# Patient Record
Sex: Female | Born: 1980 | Race: White | Hispanic: No | Marital: Married | Smoking: Never smoker
Health system: Southern US, Community
[De-identification: ages and names within clinical notes are randomized; demographics above are authoritative.]

## PROBLEM LIST (undated history)

## (undated) DIAGNOSIS — I10 Essential (primary) hypertension: Secondary | ICD-10-CM

## (undated) HISTORY — DX: Essential (primary) hypertension: I10

---

## 2014-06-09 ENCOUNTER — Ambulatory Visit: Payer: Self-pay | Admitting: Family Medicine

## 2014-06-16 DIAGNOSIS — A0472 Enterocolitis due to Clostridium difficile, not specified as recurrent: Secondary | ICD-10-CM | POA: Insufficient documentation

## 2014-06-28 ENCOUNTER — Ambulatory Visit (INDEPENDENT_AMBULATORY_CARE_PROVIDER_SITE_OTHER): Payer: PRIVATE HEALTH INSURANCE | Admitting: Internal Medicine

## 2014-06-28 ENCOUNTER — Encounter: Payer: Self-pay | Admitting: Internal Medicine

## 2014-06-28 VITALS — BP 122/64 | HR 120 | Temp 98.5°F | Resp 12 | Ht 63.0 in | Wt 121.0 lb

## 2014-06-28 DIAGNOSIS — R799 Abnormal finding of blood chemistry, unspecified: Secondary | ICD-10-CM

## 2014-06-28 DIAGNOSIS — R7989 Other specified abnormal findings of blood chemistry: Secondary | ICD-10-CM | POA: Insufficient documentation

## 2014-06-28 DIAGNOSIS — R197 Diarrhea, unspecified: Secondary | ICD-10-CM | POA: Insufficient documentation

## 2014-06-28 LAB — TSH: TSH: 2 u[IU]/mL (ref 0.35–4.50)

## 2014-06-28 LAB — T3, FREE: T3 FREE: 2.7 pg/mL (ref 2.3–4.2)

## 2014-06-28 LAB — T4, FREE: FREE T4: 1.06 ng/dL (ref 0.60–1.60)

## 2014-06-28 NOTE — Patient Instructions (Signed)
Please stop at the lab. Please try to join MyChart for easier communication. I will send you the results through MyChart. We will discuss about further plan when labs are back.

## 2014-06-28 NOTE — Progress Notes (Signed)
Patient ID: Teresa Ray, female   DOB: Apr 11, 1981, 33 y.o.   MRN: [id number]   HPI  Teresa Ray is a 33 y.o.-year-old female, referred by her PCP, Dr. Sherley Bounds, in consultation for high TSH levels in the setting of high total thyroid hormones.  Pt saw the PCP for GI sxs: diarrhea (since 06, 2015, still continues - suspected to have Cdiff inf. - tx with Flagyl 10 days >> no relief >> then Vanc po started 4 days ago >> no results yet). She tried Probiotics and did not help. She was tested for celiac ds >> negative. She lost 5 lbs since June.   I reviewed pt's thyroid tests: 05/25/2014: TSH 7.57, total T4 11.3 (4.5-12), TPO 13 (0-34) 05/13/2014: TSH 4.820, total T4 12.4 (4.5-12), ATA <1  Of note the patient is on oral contraceptives.  Aside diarrhea, pt describes: - no fatigue - feels cold usually, this is long-standing for her - weight loss - 5 lbs in last 2-3 mo - no heat intolerance - no dry skin - no hair falling - no depression/anxiety - no insomnia  Pt denies feeling nodules in neck, hoarseness, dysphagia/odynophagia, SOB with lying down.  She has no FH of thyroid disorders. No FH of thyroid cancer.  No h/o radiation tx to head or neck. No recent use of iodine supplements.  ROS: Constitutional: see HPI Eyes: no blurry vision, no xerophthalmia ENT: no sore throat, no nodules palpated in throat, no dysphagia/odynophagia, no hoarseness Cardiovascular: no CP/SOB/palpitations/leg swelling Respiratory: no cough/SOB Gastrointestinal: no N/V/+ D Musculoskeletal: no muscle/+ joint aches Skin: no rashes Neurological: no tremors/numbness/tingling/dizziness Psychiatric: no depression/anxiety  PMH: - see HPI   - HTN during pregnancy 2011  History   Social History  . Marital Status: Married    Spouse Name: N/A    Number of Children: 1   Occupational History  . accountant   Social History Main Topics  . Smoking status: Never Smoker   . Smokeless tobacco: No  . Alcohol  Use: Wine, 1 drink, 2-3x a week  . Drug Use: No   Current Outpatient Rx  Name  Route  Sig  Dispense  Refill  . levonorgestrel-ethinyl estradiol (AVIANE) 0.1-20 MG-MCG tablet   Oral   Take by mouth.         . Multiple Vitamin (MULTI-VITAMINS) TABS   Oral   Take 1 tablet by mouth daily.          . vancomycin (VANCOCIN) 250 MG capsule   Oral   Take 250 mg by mouth 4 (four) times daily.            Allergies  Allergen Reactions  . Oxycodone-Acetaminophen Nausea And Vomiting   FH: - brain aneurysms - father - HTN - father, GF, GM - HL - GM - heart ds - GM + see HPI.  PE: BP 122/64  Pulse 120  Temp(Src) 98.5 F (36.9 C) (Oral)  Resp 12  Ht  (1.6 m)  Wt 121 lb (54.885 kg)  BMI 21.44 kg/m2  SpO2 98% Wt Readings from Last 3 Encounters:  06/28/14 121 lb (54.885 kg)   Constitutional: normal weight, in NAD Eyes: PERRLA, EOMI, no exophthalmos ENT: moist mucous membranes, no thyromegaly, no cervical lymphadenopathy Cardiovascular: tachycardia, RR, No MRG Respiratory: CTA B Gastrointestinal: abdomen soft, NT, ND, BS+ Musculoskeletal: no deformities, strength intact in all 4 Skin: moist, warm, no rashes Neurological: slight tremor with outstretched hands, DTR normal in all 4  ASSESSMENT: 1. Low TSH  PLAN:  1. Patient with recently found high TSH x2, with high or borderline high total T4 levels. She appears euthyroid. She does not appear to have a goiter, thyroid nodules, or neck compression symptoms. She has tachycardia, which is not unusual in the setting of dehydration from her diarrhea. - We discussed about the pituitary-thyroid axis physiology and about the fact that being on OCPs will increase the thyroxine-binging globulin (TBG), which can elevate the total T4 to maintain normal free T4 levels. That is why, especially in pts on OCPs, we need to check the free hormones to better discern what pathology she has (clinical or subclinical hypothyroidism, central  hyperthyroidism, etc.) - will check thyroid tests today: TSH, free T4, free T3 today - if these are normal >> will recheck when she comes back in 3 mo - If she does have subclinical hypothyroidism, this may be transient 2/2 to her diarrhea >> in this case, especially since she does not have hypothyroid sxs, we can wait until her diarrhea resolves and check labs again then Return in about 3 months (around 09/27/2014).  Office Visit on 06/28/2014  Component Date Value Ref Range Status  . TSH 06/28/2014 2.00  0.35 - 4.50 uIU/mL Final  . Free T4 06/28/2014 1.06  0.60 - 1.60 ng/dL Final  . T3, Free 16/07/9603 2.7  2.3 - 4.2 pg/mL Final  All labs normal >> will recheck when she comes back in 3 months.

## 2014-09-27 ENCOUNTER — Ambulatory Visit (INDEPENDENT_AMBULATORY_CARE_PROVIDER_SITE_OTHER): Payer: PRIVATE HEALTH INSURANCE | Admitting: Internal Medicine

## 2014-09-27 ENCOUNTER — Encounter: Payer: Self-pay | Admitting: Internal Medicine

## 2014-09-27 VITALS — BP 122/66 | HR 84 | Temp 98.4°F | Wt 119.2 lb

## 2014-09-27 DIAGNOSIS — R7989 Other specified abnormal findings of blood chemistry: Secondary | ICD-10-CM

## 2014-09-27 LAB — T3, FREE: T3, Free: 3.4 pg/mL (ref 2.3–4.2)

## 2014-09-27 LAB — TSH: TSH: 4.99 u[IU]/mL — ABNORMAL HIGH (ref 0.35–4.50)

## 2014-09-27 LAB — T4, FREE: FREE T4: 0.85 ng/dL (ref 0.60–1.60)

## 2014-09-27 NOTE — Patient Instructions (Signed)
Please stop at the lab. Please have your thyroid tests (TSH, free t4) checked yearly.

## 2014-09-27 NOTE — Progress Notes (Signed)
Patient ID: Teresa Ray, female   DOB: May 30, 1981, 33 y.o.   MRN: [id number]   HPI  Teresa FilesKelly Ray is a 33 y.o.-year-old female, returning for f/u for high TSH levels. Last visit 3 mo ago.   She is now recovering after C diff infection: 3 episodes (06-07/2014).  Reviewed hx: Pt saw the PCP for GI sxs: diarrhea (since 06, 2015, still continues - suspected to have Cdiff inf. - tx with Flagyl 10 days >> no relief >> then Vanc po started 4 days ago >> no results yet). She tried Probiotics and did not help. She was tested for celiac ds >> negative. She lost 5 lbs since June.   I reviewed pt's thyroid tests: Component     Latest Ref Rng 06/28/2014  TSH     0.35 - 4.50 uIU/mL 2.00  Free T4     0.60 - 1.60 ng/dL 2.131.06  T3, Free     2.3 - 4.2 pg/mL 2.7   05/25/2014: TSH 7.57, total T4 11.3 (4.5-12), TPO 13 (0-34) 05/13/2014: TSH 4.820, total T4 12.4 (4.5-12), ATA <1  Of note the patient is on oral contraceptives.  Aside diarrhea, pt describes: - no fatigue - feels cold usually, this is long-standing for her - weight loss - 2 lbs in last 3 mo - no heat intolerance - no dry skin - no hair falling - no depression/anxiety - no insomnia  Pt denies feeling nodules in neck, hoarseness, dysphagia/odynophagia, SOB with lying down.  I reviewed pt's medications, allergies, PMH, social hx, family hx and no changes required, except as mentioned above.  ROS: Constitutional: see HPI Eyes: no blurry vision, no xerophthalmia ENT: no sore throat, no nodules palpated in throat, no dysphagia/odynophagia, no hoarseness Cardiovascular: no CP/SOB/palpitations/leg swelling Respiratory: no cough/SOB Gastrointestinal: no N/V/+ D Musculoskeletal: no muscle/joint aches Skin: no rashes Neurological: no tremors/numbness/tingling/dizziness  PE: BP 122/66 mmHg  Pulse 84  Temp(Src) 98.4 F (36.9 C) (Oral)  Wt 119 lb 3.2 oz (54.069 kg)  SpO2 98%  LMP 08/31/2014 Wt Readings from Last 3 Encounters:   09/27/14 119 lb 3.2 oz (54.069 kg)  06/28/14 121 lb (54.885 kg)   Constitutional: normal weight, in NAD Eyes: PERRLA, EOMI, no exophthalmos ENT: moist mucous membranes, no thyromegaly, no cervical lymphadenopathy Cardiovascular: tachycardia, RR, No MRG Respiratory: CTA B Gastrointestinal: abdomen soft, NT, ND, BS+ Musculoskeletal: no deformities, strength intact in all 4 Skin: moist, warm, no rashes Neurological: slight tremor with outstretched hands, DTR normal in all 4  ASSESSMENT: 1. High TSH  PLAN:  1. Patient h/o high TSH x2, with high or borderline high total T4 levels. At last visit, we repeated her TFTs >> all normal. She does not have Hashimoto's thyroiditis (TPO ABs negative). She appears euthyroid. She does not appear to have a goiter, thyroid nodules, or neck compression symptoms.  - she recovered from her C diff infection >> test negative in 07/2014. Her weight increased since then and she is not tachycardic anymore. - will check thyroid tests today: TSH, free T4, free T3 today - if these are normal >> she can have them repeated yearly by PCP   Office Visit on 09/27/2014  Component Date Value Ref Range Status  . T3, Free 09/27/2014 3.4  2.3 - 4.2 pg/mL Final  . Free T4 09/27/2014 0.85  0.60 - 1.60 ng/dL Final  . TSH 08/65/784611/30/2015 4.99* 0.35 - 4.50 uIU/mL Final   TSH a little high again. I would like to repeat the labs in 2 mo,  but no intervention needed for now. I would like to see her back in 1 year, but will continue to follow her labs until then.

## 2014-09-27 NOTE — Progress Notes (Signed)
Pre visit review using our clinic review tool, if applicable. No additional management support is needed unless otherwise documented below in the visit note. 

## 2015-07-20 ENCOUNTER — Ambulatory Visit (INDEPENDENT_AMBULATORY_CARE_PROVIDER_SITE_OTHER): Payer: PRIVATE HEALTH INSURANCE | Admitting: Family Medicine

## 2015-07-20 ENCOUNTER — Encounter: Payer: Self-pay | Admitting: Family Medicine

## 2015-07-20 VITALS — BP 118/70 | HR 97 | Temp 97.9°F | Resp 16 | Ht 63.0 in | Wt 127.0 lb

## 2015-07-20 DIAGNOSIS — N760 Acute vaginitis: Secondary | ICD-10-CM | POA: Diagnosis not present

## 2015-07-20 DIAGNOSIS — B3731 Acute candidiasis of vulva and vagina: Secondary | ICD-10-CM

## 2015-07-20 DIAGNOSIS — Z8619 Personal history of other infectious and parasitic diseases: Secondary | ICD-10-CM

## 2015-07-20 DIAGNOSIS — L298 Other pruritus: Secondary | ICD-10-CM | POA: Diagnosis not present

## 2015-07-20 DIAGNOSIS — A499 Bacterial infection, unspecified: Secondary | ICD-10-CM

## 2015-07-20 DIAGNOSIS — B373 Candidiasis of vulva and vagina: Secondary | ICD-10-CM | POA: Diagnosis not present

## 2015-07-20 DIAGNOSIS — N898 Other specified noninflammatory disorders of vagina: Secondary | ICD-10-CM | POA: Insufficient documentation

## 2015-07-20 DIAGNOSIS — B9689 Other specified bacterial agents as the cause of diseases classified elsewhere: Secondary | ICD-10-CM

## 2015-07-20 DIAGNOSIS — Z8679 Personal history of other diseases of the circulatory system: Secondary | ICD-10-CM | POA: Insufficient documentation

## 2015-07-20 LAB — POCT WET + KOH PREP: Trich by wet prep: ABSENT

## 2015-07-20 MED ORDER — METRONIDAZOLE 500 MG PO TABS: 500.0000 mg | ORAL_TABLET | Freq: Two times a day (BID) | ORAL | Status: AC

## 2015-07-20 MED ORDER — NYSTATIN-TRIAMCINOLONE 100000-0.1 UNIT/GM-% EX OINT: 1.0000 "application " | TOPICAL_OINTMENT | Freq: Two times a day (BID) | CUTANEOUS | Status: AC

## 2015-07-20 MED ORDER — FLUCONAZOLE 150 MG PO TABS: 150.0000 mg | ORAL_TABLET | Freq: Once | ORAL | Status: AC

## 2015-07-20 NOTE — Progress Notes (Signed)
Name: Teresa Ray   MRN: [id number]    DOB: [birth year]   Date:07/20/2015       Progress Note  Subjective  Chief Complaint  Chief Complaint  Patient presents with  . Vaginal Itching    patient stated that it started about 3 weeks ago. patient said it has been off and on. some discharge but nothing out of the ordinary.    HPI  Teresa Ray is a 34 year old female with complaints of 3 weeks duration of vaginal irritation. Started off mild she did not think anything of it, she then had her menses then after that the pain, irritation, itching at the labia majora got worse. Discharge is scant white and there is no associated pelvic pain, fevers, chills, dysuria, urinary frequency, flank pain. She denies pregnancy, recent sexual partners, new condom use, new feminine product use, new under garment, new laundry detergents. She denies douching.   Past Medical History  Diagnosis Date  . Hypertension     Social History  Substance Use Topics  . Smoking status: Never Smoker   . Smokeless tobacco: Not on file  . Alcohol Use: 0.0 oz/week    0 Standard drinks or equivalent per week     Comment: occasional     Current outpatient prescriptions:  .  levonorgestrel-ethinyl estradiol (AVIANE) 0.1-20 MG-MCG tablet, Take by mouth., Disp: , Rfl:  .  Multiple Vitamin (MULTI-VITAMINS) TABS, Take 1 tablet by mouth daily. , Disp: , Rfl:   Allergies  Allergen Reactions  . Oxycodone-Acetaminophen Nausea And Vomiting    ROS  Positive for vaginal discharge and pruritis as mentioned in HPI, otherwise all systems reviewed and are negative.  Objective  Filed Vitals:   07/20/15 1001  BP: 118/70  Pulse: 97  Temp: 97.9 F (36.6 C)  TempSrc: Oral  Resp: 16  Height:  (1.6 m)  Weight: 127 lb (57.607 kg)  SpO2: 96%   Body mass index is 22.5 kg/(m^2).   Physical Exam  Constitutional: Patient appears well-developed and well-nourished. In no acute distress. Cardiovascular: Normal rate,  regular rhythm and normal heart sounds.  No murmur heard.  Pulmonary/Chest: Effort normal and breath sounds normal. No respiratory distress. Abdomen: Soft with normal bowel sounds, no tenderness on deep palpation over suprapubic area, no reproducible flank tenderness bilaterally.  Genitourinary: External view labia majora with erythema not involving crus of legs or peri-rectal area. Non tender, no abscess. White scant mucous discharge from vaginal vault.  Skin: Skin is warm and dry. No rash noted. No erythema.  Psychiatric: Patient has a normal mood and affect. Behavior is normal in office today. Judgment and thought content normal in office today.   Assessment & Plan  1. Vaginal pruritus Wet prep shows few clue cells and yeast hyphae present, no trich. Plan to start Metronidazole for 7 days followed by Fluconazole. May use topical Mycolog throughout treatment course.  The patient has been counseled on the proper use, side effects and potential interactions of the new medication. Patient encouraged to review the side effects and safety profile pamphlet provided with the prescription from the pharmacy as well as request counseling from the pharmacy team as needed.    - POCT wet + KOH prep; Standing - POCT wet + KOH prep - metroNIDAZOLE (FLAGYL) 500 MG tablet; Take 1 tablet (500 mg total) by mouth 2 (two) times daily.  Dispense: 14 tablet; Refill: 0 - fluconazole (DIFLUCAN) 150 MG tablet; Take 1 tablet (150 mg total) by mouth once.  Dispense: 1 tablet; Refill: 0 - nystatin-triamcinolone ointment (MYCOLOG); Apply 1 application topically 2 (two) times daily.  Dispense: 30 g; Refill: 0  2. Bacterial vaginosis  - metroNIDAZOLE (FLAGYL) 500 MG tablet; Take 1 tablet (500 mg total) by mouth 2 (two) times daily.  Dispense: 14 tablet; Refill: 0 - fluconazole (DIFLUCAN) 150 MG tablet; Take 1 tablet (150 mg total) by mouth once.  Dispense: 1 tablet; Refill: 0 - nystatin-triamcinolone ointment (MYCOLOG);  Apply 1 application topically 2 (two) times daily.  Dispense: 30 g; Refill: 0  3. Vaginal yeast infection  - metroNIDAZOLE (FLAGYL) 500 MG tablet; Take 1 tablet (500 mg total) by mouth 2 (two) times daily.  Dispense: 14 tablet; Refill: 0 - fluconazole (DIFLUCAN) 150 MG tablet; Take 1 tablet (150 mg total) by mouth once.  Dispense: 1 tablet; Refill: 0 - nystatin-triamcinolone ointment (MYCOLOG); Apply 1 application topically 2 (two) times daily.  Dispense: 30 g; Refill: 0

## 2015-07-20 NOTE — Patient Instructions (Signed)

## 2015-07-25 ENCOUNTER — Telehealth: Payer: Self-pay

## 2015-07-25 NOTE — Telephone Encounter (Signed)
Patient was informed of Dr. Debby Freiberg message and she said thank you very much.

## 2015-07-25 NOTE — Telephone Encounter (Signed)
Patient has taken 5 of 7 days of her Flagyl, and since then she has had diarrhea and nausea with dry heaving. She stated she has not taken the Diflucan yet and wanted to know what should she do. I informed her that I would relay the information to Dr. Sherley Bounds and would give her a call back.

## 2015-07-25 NOTE — Telephone Encounter (Signed)
Several antibiotics can cause loose bowel movements, does not necessarily mean C. Diff infection, she can stop Flagyl if it is intolerant to her and just take the diflucan. If symptoms persist follow up with Gynecologist.

## 2016-01-15 IMAGING — US ULTRASOUND RETROPERITONEAL COMPLETE
1 series · 14 of 25 positions shown · non-contrast
Comparison: None.

CLINICAL DATA: Prominent aorta on physical exam

EXAM:
RETROPERITONEAL ULTRASOUND COMPLETE
TECHNIQUE: Ultrasound examination of the abdominal aorta was performed to
evaluate for abdominal aortic aneurysm. The common iliac arteries,
IVC, and kidneys were also evaluated.

[Series 1: ultrasound retroperitoneal complete · 0.20mm/px · 14 of 48 slices shown]
[im 1/48]
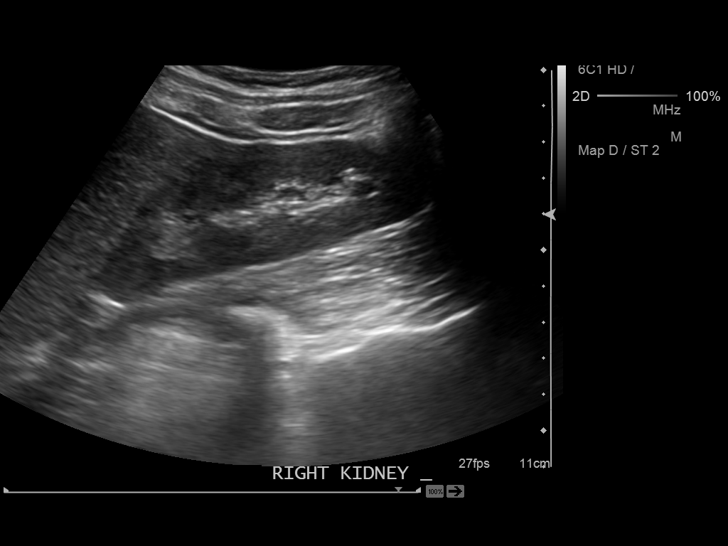
[im 4/48]
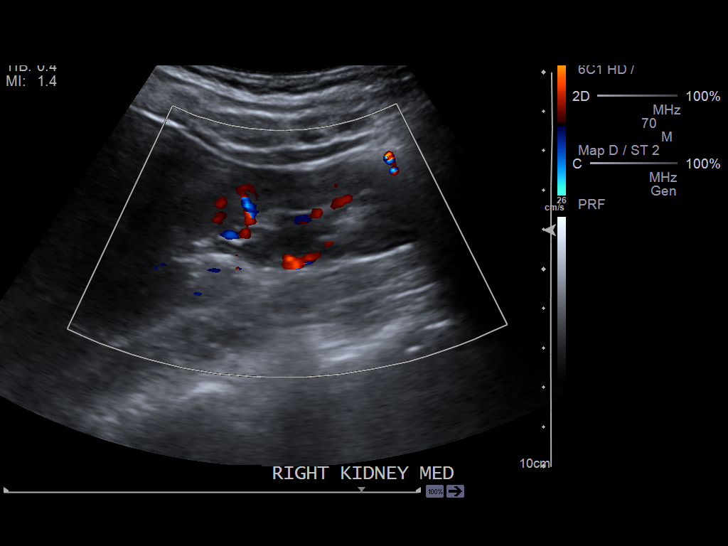
[im 8/48]
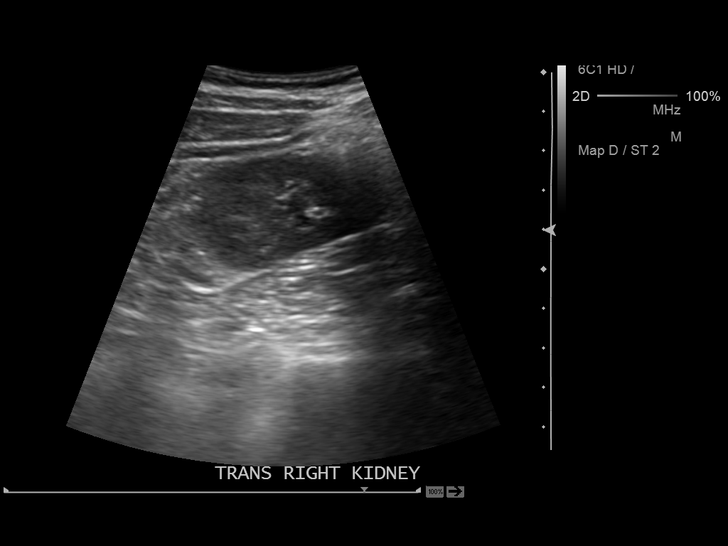
[im 12/48]
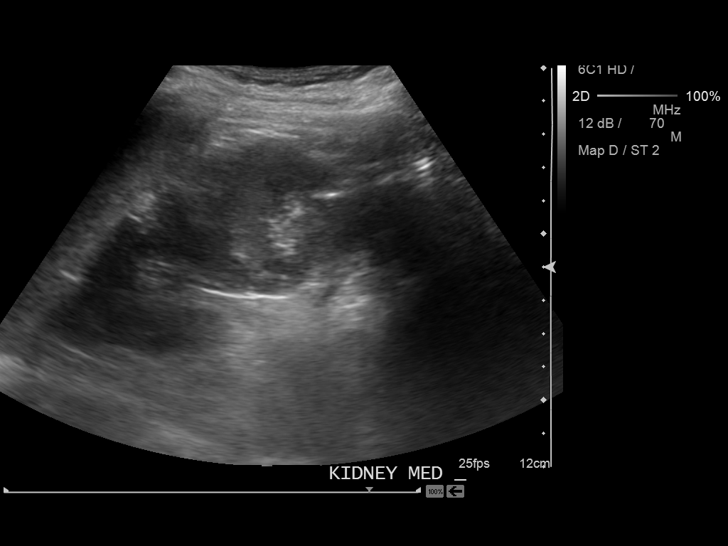
[im 16/48]
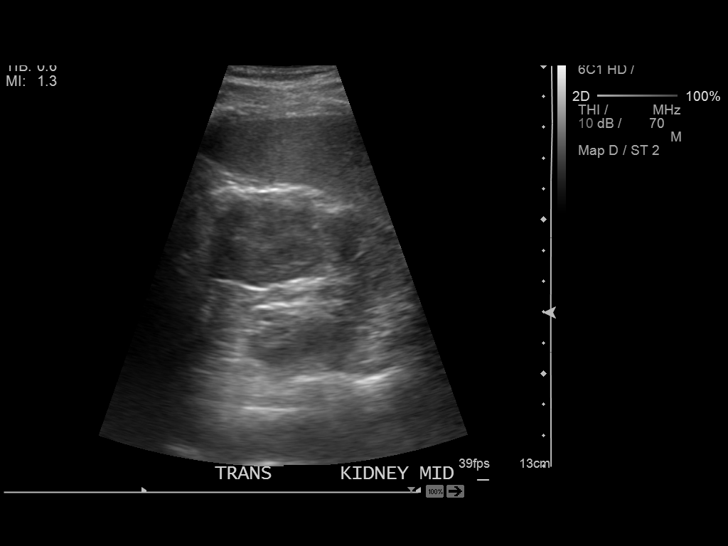
[im 18/48]
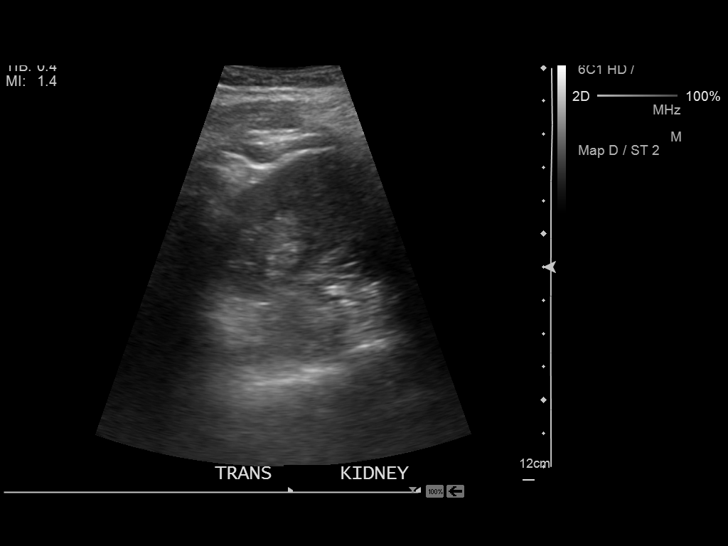
[im 22/48]
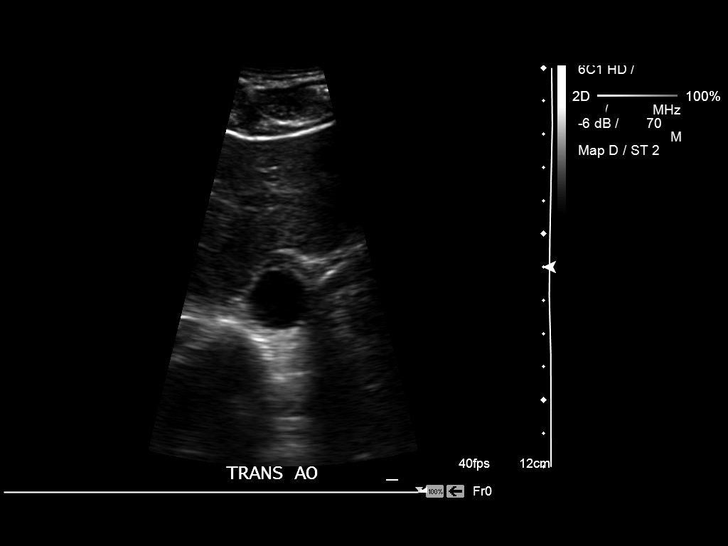
[im 26/48]
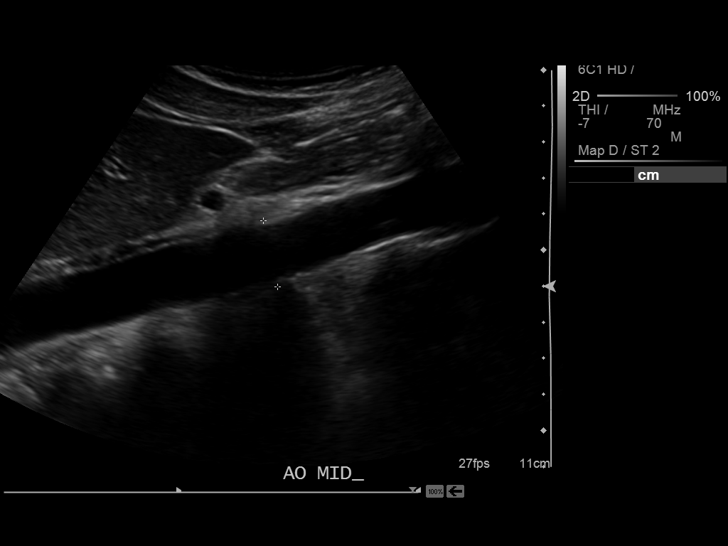
[im 30/48]
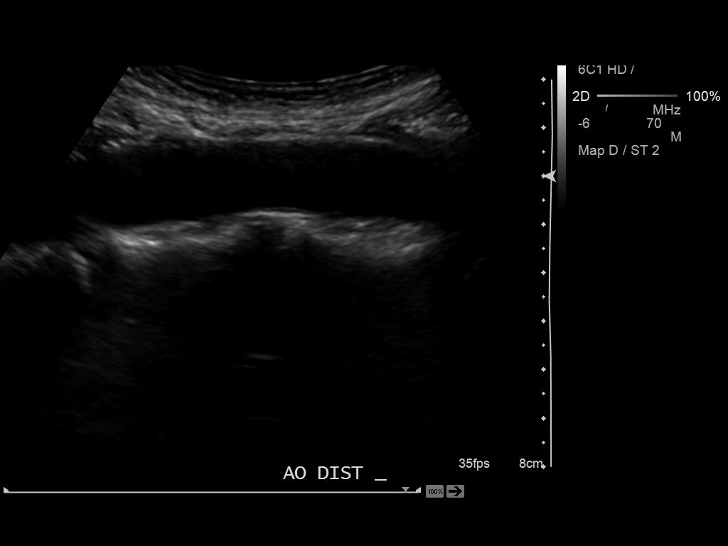
[im 32/48]
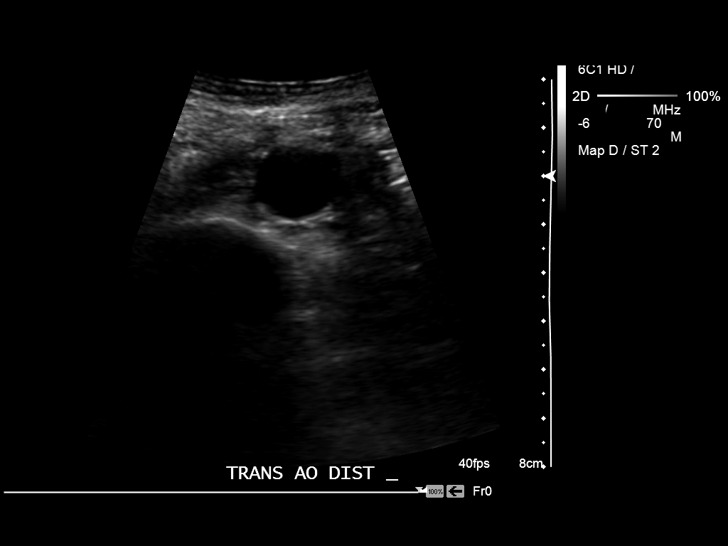
[im 36/48]
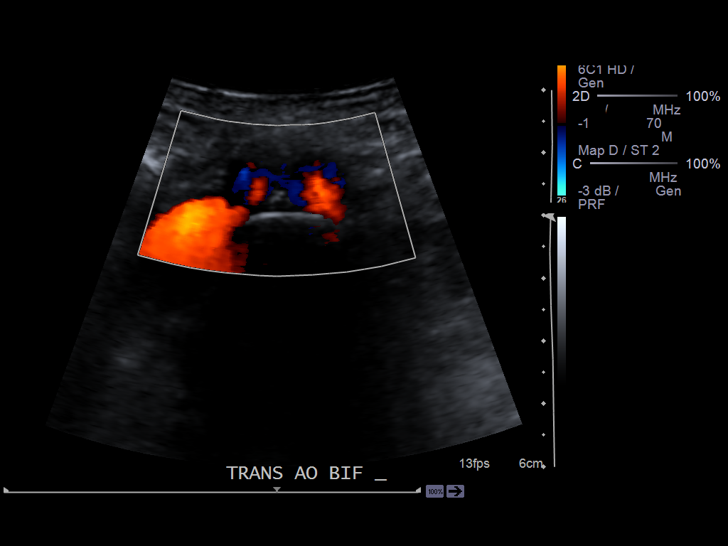
[im 40/48]
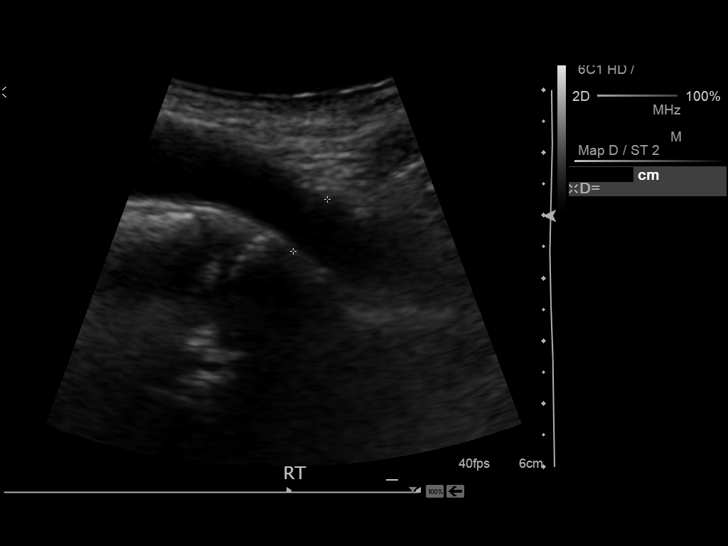
[im 44/48]
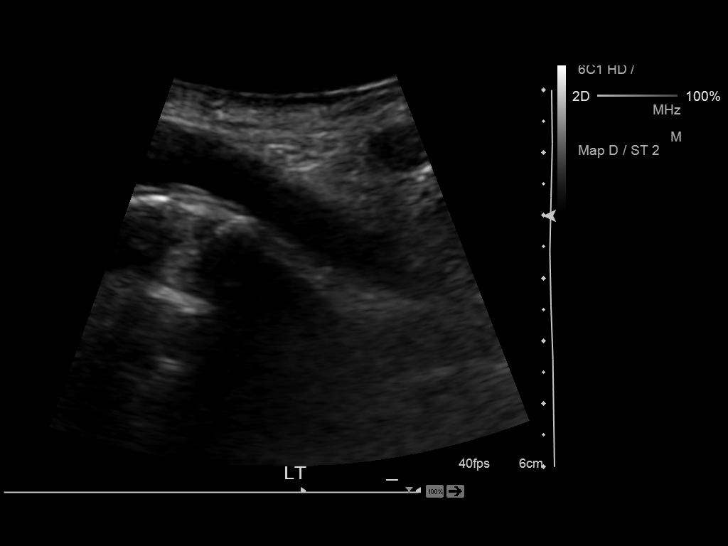
[im 48/48]
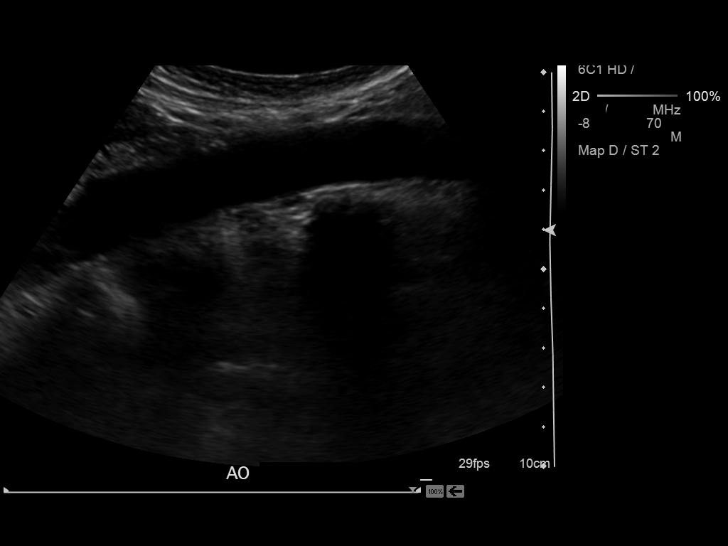

[14 of 25 positions shown; findings below may reference images not displayed]

FINDINGS: Abdominal Aorta

No aneurysm identified.

Maximum AP

Diameter:  2.1 cm.

Maximum TRV

Diameter: 1.8 cm.

Right Common Iliac Artery

No aneurysm identified.

Left Common Iliac Artery

No aneurysm identified.

IVC

No abnormality visualized.

Right Kidney

Length: 9.8 cm. Echogenicity within normal limits. No mass or
hydronephrosis visualized.

Left Kidney

Length: 10.3 cm. Echogenicity within normal limits. No mass or
hydronephrosis visualized.
IMPRESSION: No acute abnormality is noted.  No aneurysm is seen.
